# Patient Record
Sex: Female | Born: 1955 | Race: White | Hispanic: No | Marital: Married | Smoking: Current every day smoker
Health system: Southern US, Community
[De-identification: ages and names within clinical notes are randomized; demographics above are authoritative.]

## PROBLEM LIST (undated history)

## (undated) DIAGNOSIS — F329 Major depressive disorder, single episode, unspecified: Secondary | ICD-10-CM

## (undated) DIAGNOSIS — F32A Depression, unspecified: Secondary | ICD-10-CM

## (undated) DIAGNOSIS — C801 Malignant (primary) neoplasm, unspecified: Secondary | ICD-10-CM

## (undated) DIAGNOSIS — I10 Essential (primary) hypertension: Secondary | ICD-10-CM

## (undated) HISTORY — PX: BREAST SURGERY: SHX581

## (undated) HISTORY — PX: PARTIAL HIP ARTHROPLASTY: SHX733

---

## 1999-07-06 ENCOUNTER — Encounter: Payer: Self-pay | Admitting: Orthopedic Surgery

## 1999-07-06 ENCOUNTER — Encounter: Admission: RE | Admit: 1999-07-06 | Discharge: 1999-07-06 | Payer: Self-pay | Admitting: Orthopedic Surgery

## 1999-07-18 ENCOUNTER — Encounter: Payer: Self-pay | Admitting: Emergency Medicine

## 1999-07-18 ENCOUNTER — Emergency Department (HOSPITAL_COMMUNITY): Admission: EM | Admit: 1999-07-18 | Discharge: 1999-07-19 | Payer: Self-pay | Admitting: Emergency Medicine

## 2006-07-27 ENCOUNTER — Encounter: Admission: RE | Admit: 2006-07-27 | Discharge: 2006-07-27 | Payer: Self-pay | Admitting: Rheumatology

## 2006-12-13 ENCOUNTER — Encounter: Admission: RE | Admit: 2006-12-13 | Discharge: 2006-12-13 | Payer: Self-pay | Admitting: Rheumatology

## 2007-07-24 ENCOUNTER — Encounter: Admission: RE | Admit: 2007-07-24 | Discharge: 2007-07-24 | Payer: Self-pay | Admitting: Rheumatology

## 2007-10-07 ENCOUNTER — Encounter: Admission: RE | Admit: 2007-10-07 | Discharge: 2007-10-07 | Payer: Self-pay | Admitting: Sports Medicine

## 2007-10-22 ENCOUNTER — Encounter: Admission: RE | Admit: 2007-10-22 | Discharge: 2007-10-22 | Payer: Self-pay | Admitting: Sports Medicine

## 2007-12-24 ENCOUNTER — Encounter: Admission: RE | Admit: 2007-12-24 | Discharge: 2007-12-24 | Payer: Self-pay | Admitting: Sports Medicine

## 2008-01-06 ENCOUNTER — Ambulatory Visit (HOSPITAL_COMMUNITY): Admission: RE | Admit: 2008-01-06 | Discharge: 2008-01-06 | Payer: Self-pay | Admitting: Neurosurgery

## 2008-01-08 ENCOUNTER — Ambulatory Visit (HOSPITAL_COMMUNITY): Admission: RE | Admit: 2008-01-08 | Discharge: 2008-01-09 | Payer: Self-pay | Admitting: Neurosurgery

## 2011-01-17 NOTE — Op Note (Signed)
NAME:  Cheryl Sharp, KARG            ACCOUNT NO.:  [id number]   MEDICAL RECORD NO.:  [id number]          PATIENT TYPE:  INP   LOCATION:  3534                         FACILITY:  [facility]   PHYSICIAN:  Hilda Lias, M.D.   DATE OF BIRTH:  07-18-1956   DATE OF PROCEDURE:  01/08/2008  DATE OF DISCHARGE:                               OPERATIVE REPORT   PREOPERATIVE DIAGNOSIS:  C8 radiculopathy secondary to C7-T1 foraminal  stenosis.  Facet arthropathy, multiple levels.   POSTOPERATIVE DIAGNOSES:  C8 radiculopathy secondary to C7-T1 foraminal  stenosis.  Facet arthropathy, multiple levels.   PROCEDURE:  Anterior C7-T1 diskectomy, foraminotomy, decompression of  both C8 nerve root, interbody fusion with autograft and allograft plate,  microscope.   SURGEON:  Hilda Lias, MD.   ASSISTANT:  Coletta Memos, MD   CLINICAL HISTORY:  The patient was seen in my office because of neck  pain radiating to the right upper extremity.  She had failed  conservative treatment.  The patient is quite miserable.  X-ray showed  multiple level of the arthropathy, but at the level of C7-T1, she has  foraminal stenosis.  Surgery was advised.   PROCEDURE:  The patient was taken to the OR, after intubation the left  side of the neck was cleaned with DuraPrep.  Transverse incision was  made through the skin, subcutaneous tissue, down to cervical spine.  X-  ray showed that were at the level of C6-C7.  From then on, it was easy  to identify C7-T1.  Incision at the level of C7-T1 anterior ligament was  made with the microscope for total diskectomy.  To the left side, the  foramen was narrow, but after decompression of the posterior ligament,  we achieved good foraminotomy.  In the right side, the patient had  combination of foraminal stenosis secondary to hypertrophy of the facet  as well as herniated disk.  Decompression of C8 nerve root was done.  This one was quite swollen and reddish.  Having good  decompression, the  endplate were drilled, piece of allograft of 7 mm with bone autograft  inside followed by a plate with screws was done.  Lateral cervical spine  showed the upper part of the plate was in good position.  From then on,  the area was irrigated.  After we accomplished hemostasis, the wound was  closed with Vicryl and Steri-Strips.           ______________________________  Hilda Lias, M.D.     EB/MEDQ  D:  01/08/2008  T:  01/09/2008  Job:  [id number]

## 2018-06-07 ENCOUNTER — Emergency Department (HOSPITAL_BASED_OUTPATIENT_CLINIC_OR_DEPARTMENT_OTHER)
Admission: EM | Admit: 2018-06-07 | Discharge: 2018-06-08 | Disposition: A | Discharge status: Home / Self Care | Payer: Medicare HMO | Attending: Emergency Medicine | Admitting: Emergency Medicine

## 2018-06-07 ENCOUNTER — Emergency Department (HOSPITAL_BASED_OUTPATIENT_CLINIC_OR_DEPARTMENT_OTHER): Payer: Medicare HMO

## 2018-06-07 ENCOUNTER — Other Ambulatory Visit: Payer: Self-pay

## 2018-06-07 ENCOUNTER — Encounter (HOSPITAL_BASED_OUTPATIENT_CLINIC_OR_DEPARTMENT_OTHER): Payer: Self-pay | Admitting: *Deleted

## 2018-06-07 DIAGNOSIS — I10 Essential (primary) hypertension: Secondary | ICD-10-CM | POA: Diagnosis not present

## 2018-06-07 DIAGNOSIS — M25462 Effusion, left knee: Secondary | ICD-10-CM | POA: Insufficient documentation

## 2018-06-07 DIAGNOSIS — M25562 Pain in left knee: Secondary | ICD-10-CM | POA: Diagnosis present

## 2018-06-07 DIAGNOSIS — F17200 Nicotine dependence, unspecified, uncomplicated: Secondary | ICD-10-CM | POA: Insufficient documentation

## 2018-06-07 DIAGNOSIS — Z79899 Other long term (current) drug therapy: Secondary | ICD-10-CM | POA: Diagnosis not present

## 2018-06-07 HISTORY — DX: Essential (primary) hypertension: I10

## 2018-06-07 HISTORY — DX: Depression, unspecified: F32.A

## 2018-06-07 HISTORY — DX: Major depressive disorder, single episode, unspecified: F32.9

## 2018-06-07 HISTORY — DX: Malignant (primary) neoplasm, unspecified: C80.1

## 2018-06-07 LAB — CBC WITH DIFFERENTIAL/PLATELET
Basophils Absolute: 0 10*3/uL (ref 0.0–0.1)
Basophils Relative: 0 %
EOS ABS: 0 10*3/uL (ref 0.0–0.7)
Eosinophils Relative: 0 %
HCT: 32.4 % — ABNORMAL LOW (ref 36.0–46.0)
HEMOGLOBIN: 11.3 g/dL — AB (ref 12.0–15.0)
LYMPHS ABS: 1.5 10*3/uL (ref 0.7–4.0)
Lymphocytes Relative: 7 %
MCH: 31.1 pg (ref 26.0–34.0)
MCHC: 34.9 g/dL (ref 30.0–36.0)
MCV: 89.3 fL (ref 78.0–100.0)
Monocytes Absolute: 2.1 10*3/uL — ABNORMAL HIGH (ref 0.1–1.0)
Monocytes Relative: 10 %
NEUTROS PCT: 83 %
Neutro Abs: 18 10*3/uL — ABNORMAL HIGH (ref 1.7–7.7)
Platelets: 463 10*3/uL — ABNORMAL HIGH (ref 150–400)
RBC: 3.63 MIL/uL — AB (ref 3.87–5.11)
RDW: 12.2 % (ref 11.5–15.5)
WBC: 21.5 10*3/uL — AB (ref 4.0–10.5)

## 2018-06-07 LAB — SEDIMENTATION RATE: SED RATE: 15 mm/h (ref 0–22)

## 2018-06-07 LAB — BASIC METABOLIC PANEL
Anion gap: 10 (ref 5–15)
BUN: 18 mg/dL (ref 8–23)
CO2: 21 mmol/L — ABNORMAL LOW (ref 22–32)
CREATININE: 0.76 mg/dL (ref 0.44–1.00)
Calcium: 9 mg/dL (ref 8.9–10.3)
Chloride: 99 mmol/L (ref 98–111)
GFR calc non Af Amer: >60 mL/min (ref >60)
Glucose, Bld: 143 mg/dL — ABNORMAL HIGH (ref 70–99)
Potassium: 4.3 mmol/L (ref 3.5–5.1)
SODIUM: 130 mmol/L — AB (ref 135–145)

## 2018-06-07 MED ORDER — MORPHINE SULFATE (PF) 4 MG/ML IV SOLN
4.0000 mg | Freq: Once | INTRAVENOUS | Status: AC
  Administered 2018-06-07: 4 mg via INTRAVENOUS
  Filled 2018-06-07: qty 1

## 2018-06-07 MED ORDER — MORPHINE SULFATE (PF) 4 MG/ML IV SOLN
4.0000 mg | Freq: Once | INTRAVENOUS | Status: AC
  Administered 2018-06-07: 4 mg via INTRAVENOUS

## 2018-06-07 MED ORDER — LIDOCAINE-EPINEPHRINE 2 %-1:100000 IJ SOLN
20.0000 mL | Freq: Once | INTRAMUSCULAR | Status: DC
  Filled 2018-06-07: qty 20

## 2018-06-07 MED ORDER — LIDOCAINE-EPINEPHRINE (PF) 2 %-1:200000 IJ SOLN
INTRAMUSCULAR | Status: AC
  Administered 2018-06-07: 10 mL
  Filled 2018-06-07: qty 10

## 2018-06-07 MED ORDER — MORPHINE SULFATE (PF) 4 MG/ML IV SOLN
INTRAVENOUS | Status: AC
  Filled 2018-06-07: qty 1

## 2018-06-07 MED ORDER — PENTAFLUOROPROP-TETRAFLUOROETH EX AERO
INHALATION_SPRAY | Freq: Once | CUTANEOUS | Status: AC
  Administered 2018-06-07: 30 via TOPICAL
  Filled 2018-06-07: qty 103.5

## 2018-06-07 NOTE — ED Provider Notes (Signed)
[facility] EMERGENCY DEPARTMENT Provider Note   CSN: [id number] Arrival date & time: 06/07/18  1746     History   Chief Complaint Chief Complaint  Patient presents with  . Knee Pain    HPI Cheryl Sharp is a 62 y.o. female with past medical history of breast cancer, gout, who presents today for evaluation of left knee pain.  She reports that she was in bed overnight and rolled over and felt something inside of her knee pop.  She reports that it has been sore since, today she is gotten in and out of her SUV multiple times and is currently in very bad pain.  She is not able to bear weight on the leg and reports that it is swollen.  She denies any fevers.  She was seen at her PCPs office yesterday for a gout flare in her right foot and started on prednisone.  HPI  Past Medical History:  Diagnosis Date  . Cancer ([place])   . Depression   . Hypertension     There are no active problems to display for this patient.   Past Surgical History:  Procedure Laterality Date  . BREAST SURGERY    . PARTIAL HIP ARTHROPLASTY       OB History   None      Home Medications    Prior to Admission medications   Medication Sig Start Date End Date Taking? Authorizing Provider  LISINOPRIL PO Take by mouth.   Yes [provider]  venlafaxine XR (EFFEXOR-XR) 150 MG 24 hr capsule Take 150 mg by mouth daily with breakfast.   Yes [provider]    Family History No family history on file.  Social History Social History   Tobacco Use  . Smoking status: Current Every Day Smoker  . Smokeless tobacco: Never Used  Substance Use Topics  . Alcohol use: Yes  . Drug use: Never     Allergies   Patient has no known allergies.   Review of Systems Review of Systems  Constitutional: Negative for chills and fever.  Musculoskeletal: Positive for joint swelling. Negative for arthralgias, back pain, neck pain and neck stiffness.  Skin: Negative for color change  and pallor.  All other systems reviewed and are negative.    Physical Exam Updated Vital Signs BP 128/78 (BP Location: Left Arm)   Pulse 98   Temp 98.3 F (36.8 C) (Oral)   Resp 20   Ht 5' 4"  (1.626 m)   Wt 54.4 kg   SpO2 99%   BMI 20.60 kg/m   Physical Exam  Constitutional: She appears well-developed and well-nourished. No distress.  HENT:  Head: Normocephalic and atraumatic.  Eyes: Conjunctivae are normal. Right eye exhibits no discharge. Left eye exhibits no discharge. No scleral icterus.  Neck: Normal range of motion.  Cardiovascular: Normal rate, regular rhythm and intact distal pulses.  Pulmonary/Chest: Effort normal. No stridor. No respiratory distress.  Abdominal: She exhibits no distension.  Musculoskeletal: She exhibits no edema or deformity.  Left knee is obviously swollen, no erythema.  No pain in ankle  Neurological: She is alert. She exhibits normal muscle tone.  Skin: Skin is warm and dry. She is not diaphoretic.  Psychiatric: She has a normal mood and affect. Her behavior is normal.  Nursing note and vitals reviewed.        ED Treatments / Results  Labs (all labs ordered are listed, but only abnormal results are displayed) Labs Reviewed  SYNOVIAL CELL  COUNT + DIFF, W/ CRYSTALS - Abnormal; Notable for the following components:      Result Value   Appearance-Synovial TURBID (*)    WBC, Synovial 27,750 (*)    Neutrophil, Synovial 70 (*)    Monocyte-Macrophage-Synovial Fluid 26 (*)    All other components within normal limits  BASIC METABOLIC PANEL - Abnormal; Notable for the following components:   Sodium 130 (*)    CO2 21 (*)    Glucose, Bld 143 (*)    All other components within normal limits  CBC WITH DIFFERENTIAL/PLATELET - Abnormal; Notable for the following components:   WBC 21.5 (*)    RBC 3.63 (*)    Hemoglobin 11.3 (*)    HCT 32.4 (*)    Platelets 463 (*)    Neutro Abs 18.0 (*)    Monocytes Absolute 2.1 (*)    All other components  within normal limits  BODY FLUID CULTURE  SEDIMENTATION RATE  GLUCOSE, BODY FLUID OTHER  PROTEIN, BODY FLUID (OTHER)  URIC ACID, BODY FLUID  C-REACTIVE PROTEIN    EKG None  Radiology Ct Knee Left Wo Contrast  Result Date: 06/07/2018 CLINICAL DATA:  Left knee pain since a twisting injury last night. Initial encounter. EXAM: CT OF THE LEFT KNEE WITHOUT CONTRAST TECHNIQUE: Multidetector CT imaging of the left knee was performed according to the standard protocol. Multiplanar CT image reconstructions were also generated. COMPARISON:  Plain films left knee this same day. FINDINGS: Bones/Joint/Cartilage There is no fracture or other acute bony or joint abnormality. Osteoarthritis about the knee is most severe in the patellofemoral compartment where there is bone-on-bone joint space narrowing. Loose bodies in the patellofemoral compartment measuring up to 1.7 cm are identified. Ligaments Suboptimally assessed by CT. The cruciate and collateral ligaments appear intact. Muscles and Tendons Intact. Soft tissues Small joint effusion is noted. A Baker's cyst measuring 1.7 cm AP x 1.7 cm transverse x 4.4 cm craniocaudal is seen. IMPRESSION: Negative for fracture.  No acute abnormality. Advanced osteoarthritis about the knee is most severe in the patellofemoral compartment. Baker's cyst. Electronically Signed   By: Inge Rise M.D.   On: 06/07/2018 20:43   Dg Knee Complete 4 Views Left  Result Date: 06/07/2018 CLINICAL DATA:  Twisted knee yesterday. History of recurrent effusions. EXAM: LEFT KNEE - COMPLETE 4+ VIEW COMPARISON:  None. FINDINGS: No acute fracture deformity or dislocation. Osteopenia without destructive bony lesions. Mild asymmetric sclerosis RIGHT tibial plateau. Severe patellofemoral compartment narrowing with periarticular sclerosis compatible with osteoarthrosis, moderate within lateral compartment and mild within medial compartment. Moderate suprapatellar joint effusion.  Lateral knee loose body. IMPRESSION: 1. Asymmetric sclerosis medial tibial tuberosity, potential fracture. Recommend correlation with point tenderness. 2. Severe patellofemoral compartment osteoarthrosis. 3. Moderate suprapatellar joint effusion and lateral knee loose body. Electronically Signed   By: Elon Alas M.D.   On: 06/07/2018 18:54    Procedures .Joint Aspiration/Arthrocentesis Date/Time: 06/08/2018 12:12 AM Performed by: Lorin Glass, PA-C Authorized by: Lorin Glass, PA-C   Consent:    Consent obtained:  Verbal and written   Consent given by:  Patient   Risks discussed:  Bleeding, incomplete drainage, infection, nerve damage, pain and poor cosmetic result   Alternatives discussed:  No treatment, alternative treatment and referral Location:    Location:  Knee   Knee:  L knee Anesthesia (see MAR for exact dosages):    Anesthesia method:  Topical application and local infiltration   Topical anesthesia: Cold spray.   Local anesthetic:  Lidocaine 2% WITH epi Procedure details:    Preparation: Patient was prepped and draped in usual sterile fashion     Needle gauge:  18 G   Approach:  Superior   Aspirate amount:  40   Aspirate characteristics:  Yellow (Opaque, first 30 cc were not bloody, cloudy. )   Steroid injected: no (5cc lidocaine 2% with epi injected into joint space)     Specimen collected: yes   Post-procedure details:    Dressing:  Adhesive bandage (ACE wrap)   Patient tolerance of procedure:  Tolerated well, no immediate complications Comments:     Patient reported significant improvement in pain after.   (including critical care time)  Medications Ordered in ED Medications  lidocaine-EPINEPHrine (XYLOCAINE W/EPI) 2 %-1:100000 (with pres) injection 20 mL (has no administration in time range)  morphine 4 MG/ML injection 4 mg (4 mg Intravenous Given 06/07/18 1817)  morphine 4 MG/ML injection 4 mg (4 mg Intravenous Given 06/07/18 1848)    pentafluoroprop-tetrafluoroeth (GEBAUERS) aerosol (30 application Topical Given 06/07/18 1922)  lidocaine-EPINEPHrine (XYLOCAINE W/EPI) 2 %-1:200000 (PF) injection (10 mLs  Given by Other 06/07/18 1922)  morphine 4 MG/ML injection 4 mg (4 mg Intravenous Given 06/07/18 2323)     Initial Impression / Assessment and Plan / ED Course  I have reviewed the triage vital signs and the nursing notes.  Pertinent labs & imaging results that were available during my care of the patient were reviewed by me and considered in my medical decision making (see chart for details).  Clinical Course as of Jun 09 19  Fri Jun 07, 2018  2027 Elevated, patient has been on prednisone since yesterday  WBC(!): 21.5 [EH]  2127 Patient reevaluated, her pain is improved, however she still does not have normal range of motion.  She is able to move her leg and bend her knee slightly without excruciating pain.   [EH]  2344 Called lab, they report they are working on the fluid now.    [EH]    Clinical Course User Index [EH] Lorin Glass, PA-C   Patient presents today for evaluation of left-sided knee pain and swelling.  She reports that overnight she rolled and felt a pop in her knee.  She has been able to walk okay today, however since then she has had a significant amount of excruciating knee pain.  She has a history of gout, and was started yesterday on prednisone for a gout flare in her right foot.  X-rays were obtained with possible fracture.  CT was obtained showing a large Baker's cyst, a foreign body within the joint, and significant arthritis.  I discussed with patient potential options, and she elected for arthrocentesis with injection of lidocaine, primarily for pain relief.  Labs were obtained and reviewed, her white count is significantly elevated, however she started prednisone yesterday for a gout flare in her right foot.  ESR is not elevated, CRP is pending.  Her synovial cell count showed yellow  fluid that was turbid with no crystals, 27,000 white blood cells, neutrophils 70.  Uric acid, glucose, protein from synovial fluid were not back at the time of discharge.  Based on the white blood cell count this does not appear fully consistent with septic arthritis.  Her knee was also not red, or abnormally warm which is reassuring.  Cultures were obtained which are in process.    Patient was reevaluated, she reports her pain is better.  She is given an Ace wrap, and crutches  while in the department.  She was offered a prescription for a walker however reports she has one at home.  She is instructed to follow-up with her primary orthopedist.  Patient was discussed with Dr. Maryan Rued.  Strict return precautions were discussed with patient who states their understanding.  At the time of discharge patient denied any unaddressed complaints or concerns.  Patient is agreeable for discharge home.   Final Clinical Impressions(s) / ED Diagnoses   Final diagnoses:  Acute pain of left knee    ED Discharge Orders    None       Lorin Glass, PA-C 06/08/18 0379    Blanchie Dessert, MD 06/08/18 1513

## 2018-06-07 NOTE — ED Notes (Signed)
Patient tolerated this the procedure. EDP withdraw 40 cc of fluid from patient's left knee.

## 2018-06-07 NOTE — ED Notes (Signed)
ED Provider at bedside. 

## 2018-06-07 NOTE — ED Triage Notes (Signed)
Left knee pain. She twisted her leg in her sleep. The pain woke her up.

## 2018-06-08 LAB — SYNOVIAL CELL COUNT + DIFF, W/ CRYSTALS
Crystals, Fluid: NONE SEEN
LYMPHOCYTES-SYNOVIAL FLD: 4 % (ref 0–20)
MONOCYTE-MACROPHAGE-SYNOVIAL FLUID: 26 % — AB (ref 50–90)
Neutrophil, Synovial: 70 % — ABNORMAL HIGH (ref 0–25)
WBC, Synovial: 27750 /mm3 — ABNORMAL HIGH (ref 0–200)

## 2018-06-08 LAB — C-REACTIVE PROTEIN: CRP: 1.6 mg/dL — AB (ref <1.0)

## 2018-06-08 MED ORDER — OXYCODONE-ACETAMINOPHEN 5-325 MG PO TABS
1.0000 | ORAL_TABLET | Freq: Once | ORAL | Status: AC
  Administered 2018-06-08: 1 via ORAL
  Filled 2018-06-08: qty 1

## 2018-06-08 MED ORDER — OXYCODONE-ACETAMINOPHEN 5-325 MG PO TABS: 1.0000 | ORAL_TABLET | Freq: Four times a day (QID) | ORAL | 0 refills | Status: AC | PRN

## 2018-06-08 NOTE — Discharge Instructions (Addendum)
If you develop fevers, significant redness around the knee, your pain is uncontrolled, or you have other concerns please seek additional medical care and evaluation.  As we discussed you have cultures pending from the fluid I drew out of your knee today.  Your initial labs do not appear fully consistent with infection, however they are abnormal.  If you need antibiotics you will be contacted.  Please take Ibuprofen (Advil, motrin) and Tylenol (acetaminophen) to relieve your pain.  You may take up to 600 MG (3 pills) of normal strength ibuprofen every 8 hours as needed.  In between doses of ibuprofen you make take tylenol, up to 1,000 mg (two extra strength pills).  Do not take more than 3,000 mg tylenol in a 24 hour period.  Please check all medication labels as many medications such as pain and cold medications may contain tylenol.  Do not drink alcohol while taking these medications.  Do not take other NSAID'S while taking ibuprofen (such as aleve or naproxen).  Please take ibuprofen with food to decrease stomach upset.  Today you received medications that may make you sleepy or impair your ability to make decisions.  For the next 24 hours please do not drive, operate heavy machinery, care for a small child with out another adult present, or perform any activities that may cause harm to you or someone else if you were to fall asleep or be impaired.   You are being prescribed a medication which may make you sleepy. Please follow up of listed precautions for at least 24 hours after taking one dose.

## 2018-06-09 LAB — URIC ACID, BODY FLUID: URIC ACID BODY FLUID: 3.6 mg/dL

## 2018-06-09 LAB — GLUCOSE, BODY FLUID OTHER: Glucose, Body Fluid Other: 63 mg/dL

## 2018-06-11 LAB — BODY FLUID CULTURE: Culture: NO GROWTH

## 2019-02-07 IMAGING — CR DG KNEE COMPLETE 4+V*L*
4 series · 4 of 4 positions shown · non-contrast
Comparison: None.

CLINICAL DATA: Twisted knee yesterday. History of recurrent
effusions.

EXAM:
LEFT KNEE - COMPLETE 4+ VIEW

[w knee lat. left *]
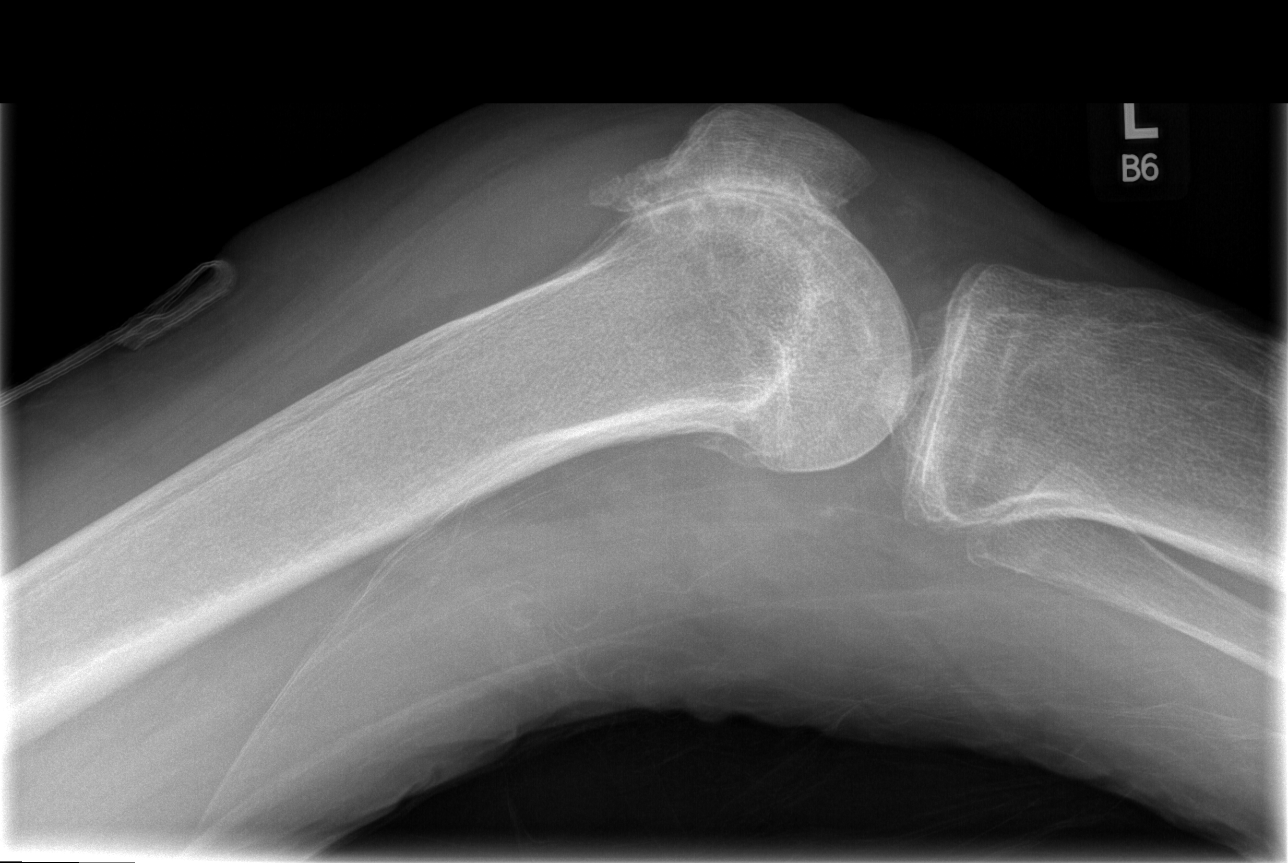

[t knee ap left]
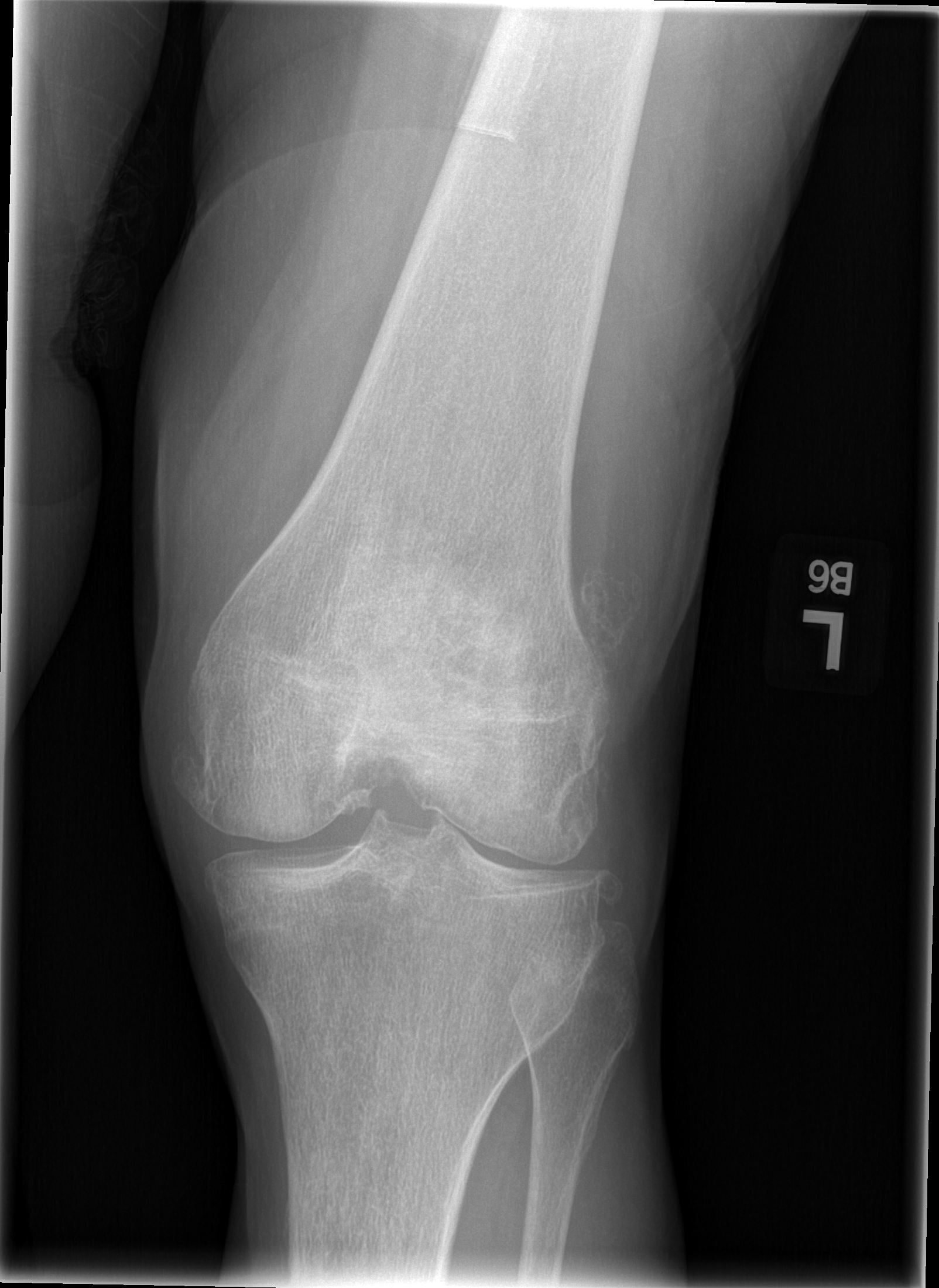

[t knee oblique left (1 of 2)]
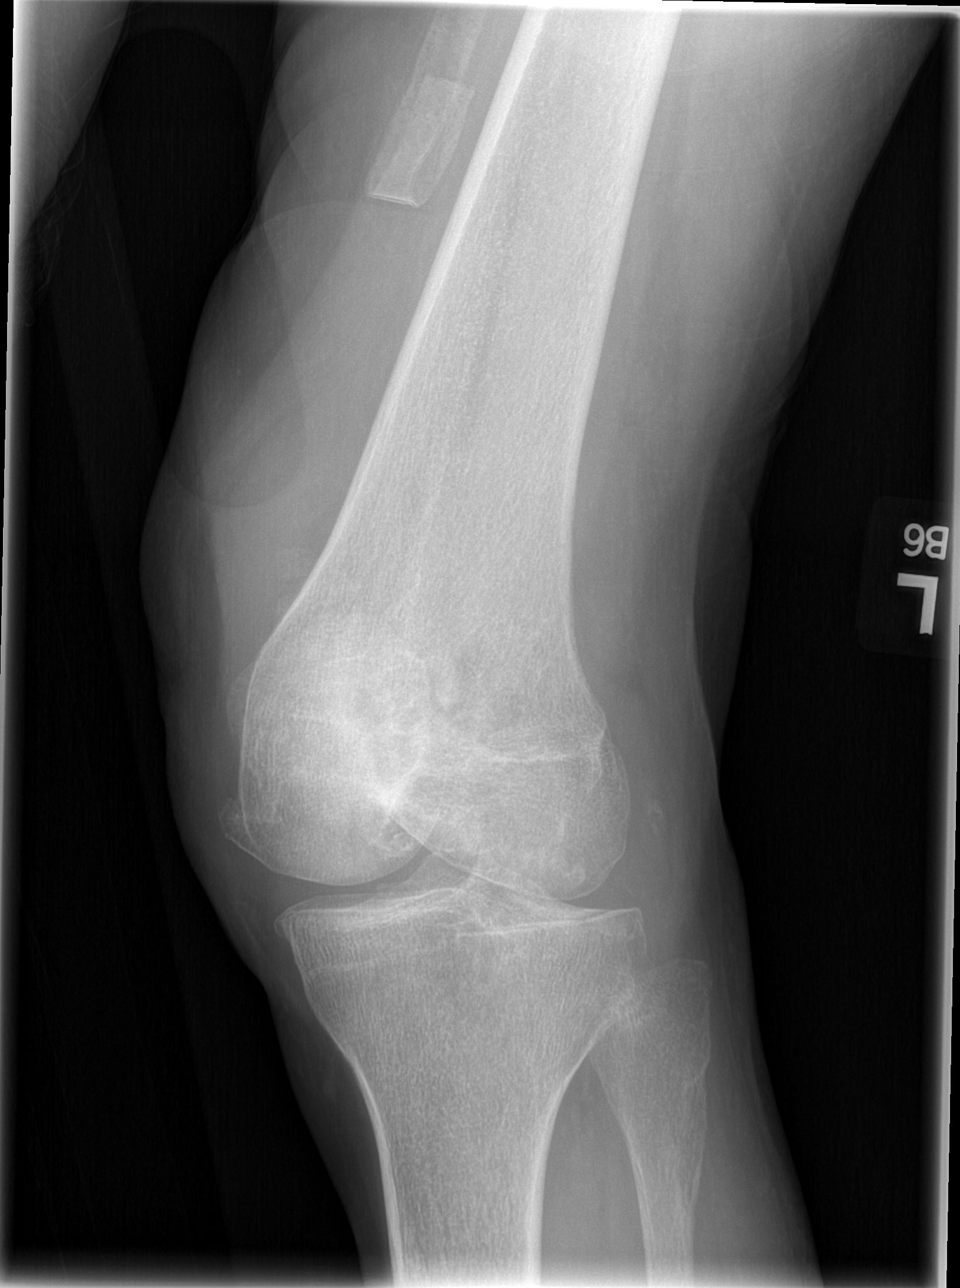

[t knee oblique left (2 of 2)]
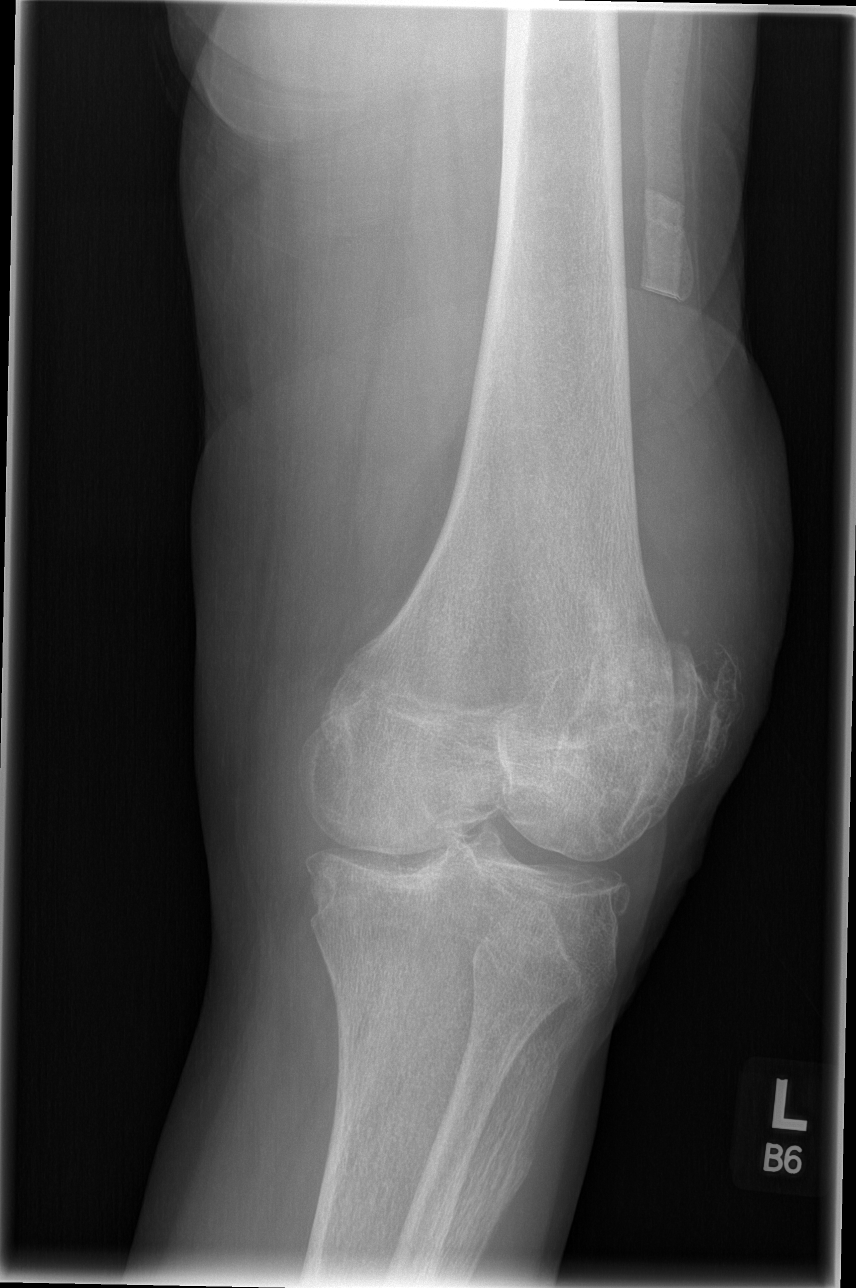

[4 of 4 positions shown; findings below may reference images not displayed]

FINDINGS: No acute fracture deformity or dislocation. Osteopenia without
destructive bony lesions. Mild asymmetric sclerosis RIGHT tibial
plateau. Severe patellofemoral compartment narrowing with
periarticular sclerosis compatible with osteoarthrosis, moderate
within lateral compartment and mild within medial compartment.
Moderate suprapatellar joint effusion. Lateral knee loose body.
IMPRESSION: 1. Asymmetric sclerosis medial tibial tuberosity, potential
fracture. Recommend correlation with point tenderness.
2. Severe patellofemoral compartment osteoarthrosis.
3. Moderate suprapatellar joint effusion and lateral knee loose
body.
